# Patient Record
Sex: Male | Born: 1995
Health system: Southern US, Community
[De-identification: ages and names within clinical notes are randomized; demographics above are authoritative.]

## PROBLEM LIST (undated history)

## (undated) DIAGNOSIS — F909 Attention-deficit hyperactivity disorder, unspecified type: Secondary | ICD-10-CM

## (undated) DIAGNOSIS — S53106A Unspecified dislocation of unspecified ulnohumeral joint, initial encounter: Secondary | ICD-10-CM

## (undated) HISTORY — DX: Unspecified dislocation of unspecified ulnohumeral joint, initial encounter: S53.106A

## (undated) HISTORY — PX: ADENOIDECTOMY: SUR15

## (undated) HISTORY — PX: FRENULECTOMY, LINGUAL: SHX1681

## (undated) HISTORY — PX: MYRINGOTOMY: SUR874

## (undated) HISTORY — PX: LAPAROSCOPIC INTUSSUSCEPTION REPAIR: SHX1927

## (undated) HISTORY — PX: APPENDECTOMY: SHX54

---
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
## 2017-03-22 ENCOUNTER — Ambulatory Visit (INDEPENDENT_AMBULATORY_CARE_PROVIDER_SITE_OTHER): Payer: 59 | Admitting: Family Medicine

## 2017-03-22 ENCOUNTER — Encounter: Payer: Self-pay | Admitting: Family Medicine

## 2017-03-22 VITALS — BP 110/68 | HR 84 | Temp 98.0°F | Resp 12 | Ht 68.0 in | Wt 208.0 lb

## 2017-03-22 DIAGNOSIS — F988 Other specified behavioral and emotional disorders with onset usually occurring in childhood and adolescence: Secondary | ICD-10-CM

## 2017-03-22 MED ORDER — AMPHETAMINE-DEXTROAMPHET ER 20 MG PO CP24: 20.0000 mg | ORAL_CAPSULE | ORAL | 0 refills | Status: DC

## 2017-03-22 NOTE — Progress Notes (Signed)
Subjective:    Patient ID: Jonathan Abbott, male    DOB: 1995-11-27, 22 y.o.   MRN: [id number]  HPI Patient is a very pleasant 22 year old white male here today for refill of his ADD medication. He is currently taking Adderall XR 15 mg by mouth every morning. Patient just completed his first semester at [employer]. He is studying physical therapy. He did well in all of his classes. His lowest grade was a C.  However he is interested in increasing the dose of his medication. He can feel the medicine wore off around 5:00 in the afternoon. This does not give him sufficient time to complete his homework at night. Therefore he has a hard time focusing and concentrating and time to finish his homework and also to complete studying. He is interested in increasing the dose slightly to see if he can get longer effectiveness.  He denies any chest pain. He denies any palpitations. He denies any insomnia. He denies any weight loss. He denies any illicit drug use. Past Medical History:  Diagnosis Date  . ADHD (attention deficit hyperactivity disorder)    Past Surgical History:  Procedure Laterality Date  . ADENOIDECTOMY    . APPENDECTOMY    . FRENULECTOMY, LINGUAL    . LAPAROSCOPIC INTUSSUSCEPTION REPAIR    . MYRINGOTOMY     Current Outpatient Medications on File Prior to Visit  Medication Sig Dispense Refill  . acetaminophen (TYLENOL) 325 MG tablet Take 650 mg by mouth every 6 (six) hours as needed.    Marland Kitchen ibuprofen (ADVIL,MOTRIN) 200 MG tablet Take 200 mg by mouth every 6 (six) hours as needed.     No current facility-administered medications on file prior to visit.    Allergies  Allergen Reactions  . Azithromycin   . Bee Venom   . Zithromax [Azithromycin Dihydrate] Rash   Social History   Socioeconomic History  . Marital status: Single    Spouse name: Not on file  . Number of children: Not on file  . Years of education: Not on file  . Highest education level: Not on  file  Social Needs  . Financial resource strain: Not on file  . Food insecurity - worry: Not on file  . Food insecurity - inability: Not on file  . Transportation needs - medical: Not on file  . Transportation needs - non-medical: Not on file  Occupational History  . Not on file  Tobacco Use  . Smoking status: Never Smoker  . Smokeless tobacco: Never Used  Substance and Sexual Activity  . Alcohol use: No  . Drug use: No  . Sexual activity: Not on file    Comment: Lives with mom, dad, and brother.  Other Topics Concern  . Not on file  Social History Narrative   Freshman at [employer].  Plays safety in football, lacrosse, and basketball.        Review of Systems  All other systems reviewed and are negative.      Objective:   Physical Exam  Constitutional: He is oriented to person, place, and time. He appears well-developed and well-nourished.  Cardiovascular: Normal rate, regular rhythm and normal heart sounds. Exam reveals no gallop and no friction rub.  No murmur heard. Pulmonary/Chest: Effort normal and breath sounds normal. No respiratory distress. He has no wheezes. He has no rales.  Abdominal: Soft. Bowel sounds are normal. He exhibits no distension. There is no tenderness. There is no rebound and no guarding.  Neurological: He is alert and oriented to person, place, and time. He has normal reflexes. He displays normal reflexes. No cranial nerve deficit. He exhibits normal muscle tone. Coordination normal.  Psychiatric: He has a normal mood and affect. His behavior is normal. Judgment and thought content normal.  Vitals reviewed.         Assessment & Plan:  Attention deficit disorder (ADD) without hyperactivity  Increase Adderall XR to 20 mg by mouth every morning. Recheck in one month. If ineffective, consider splitting the dose and the twice a day dosing.

## 2017-04-29 ENCOUNTER — Other Ambulatory Visit: Payer: Self-pay | Admitting: Family Medicine

## 2017-04-29 MED ORDER — AMPHETAMINE-DEXTROAMPHET ER 20 MG PO CP24: 20.0000 mg | ORAL_CAPSULE | ORAL | 0 refills | Status: DC

## 2017-04-29 NOTE — Telephone Encounter (Signed)
Requesting refill  Adderall  LOV: 03/22/17  LRF:  03/22/17

## 2017-06-02 ENCOUNTER — Other Ambulatory Visit: Payer: Self-pay | Admitting: Family Medicine

## 2017-06-02 NOTE — Telephone Encounter (Signed)
Pt requesting refill on Adderall      LOV: 03/22/17 LRF:  04/29/17

## 2017-06-03 MED ORDER — AMPHETAMINE-DEXTROAMPHET ER 20 MG PO CP24: 20.0000 mg | ORAL_CAPSULE | ORAL | 0 refills | Status: DC

## 2017-07-20 ENCOUNTER — Other Ambulatory Visit: Payer: Self-pay | Admitting: Family Medicine

## 2017-07-20 MED ORDER — AMPHETAMINE-DEXTROAMPHET ER 20 MG PO CP24: 20.0000 mg | ORAL_CAPSULE | ORAL | 0 refills | Status: DC

## 2017-07-20 NOTE — Telephone Encounter (Signed)
Requesting refill    Adderall  LOV:  03/22/17  LRF:  06/03/17

## 2017-09-17 ENCOUNTER — Other Ambulatory Visit: Payer: Self-pay | Admitting: Family Medicine

## 2017-09-17 MED ORDER — AMPHETAMINE-DEXTROAMPHET ER 20 MG PO CP24: 20.0000 mg | ORAL_CAPSULE | ORAL | 0 refills | Status: DC

## 2017-09-17 NOTE — Telephone Encounter (Signed)
Pt requesting refill on Adderall      LOV: 03/22/17  LRF:   07/20/17

## 2017-11-19 ENCOUNTER — Other Ambulatory Visit: Payer: Self-pay | Admitting: Family Medicine

## 2017-11-19 MED ORDER — AMPHETAMINE-DEXTROAMPHET ER 20 MG PO CP24: 20.0000 mg | ORAL_CAPSULE | ORAL | 0 refills | Status: DC

## 2017-11-19 NOTE — Telephone Encounter (Signed)
Pt requesting refill on Adderall      LOV: 03/22/17  LRF: 09/17/17

## 2018-04-22 ENCOUNTER — Other Ambulatory Visit: Payer: Self-pay | Admitting: Family Medicine

## 2018-04-22 MED ORDER — AMPHETAMINE-DEXTROAMPHET ER 20 MG PO CP24: 20.0000 mg | ORAL_CAPSULE | ORAL | 0 refills | Status: DC

## 2018-04-22 NOTE — Telephone Encounter (Signed)
Pt requesting refill on Adderall      LOV:  03/22/17  LRF:   11/19/17

## 2018-04-22 NOTE — Telephone Encounter (Signed)
Mother of pt aware.

## 2018-04-22 NOTE — Telephone Encounter (Signed)
Needs ov, > 1 year

## 2018-04-27 MED ORDER — AMPHETAMINE-DEXTROAMPHET ER 20 MG PO CP24: 20.0000 mg | ORAL_CAPSULE | ORAL | 0 refills | Status: DC

## 2018-04-27 MED FILL — ADDERALL XR 20 MG CAP SA: 20 | 30 days supply | Qty: 30 | Fill #0

## 2018-04-27 NOTE — Telephone Encounter (Signed)
Please resend - was sent to wrong pharmacy.

## 2018-04-27 NOTE — Addendum Note (Signed)
Addended by: Legrand Rams B on: 04/27/2018 08:56 AM   Modules accepted: Orders

## 2018-09-09 ENCOUNTER — Other Ambulatory Visit: Payer: Self-pay

## 2018-09-09 ENCOUNTER — Encounter: Payer: Self-pay | Admitting: Family Medicine

## 2018-09-09 ENCOUNTER — Ambulatory Visit (INDEPENDENT_AMBULATORY_CARE_PROVIDER_SITE_OTHER): Payer: No Typology Code available for payment source | Admitting: Family Medicine

## 2018-09-09 VITALS — BP 122/64 | HR 100 | Temp 98.7°F | Resp 14 | Ht 68.0 in | Wt 186.0 lb

## 2018-09-09 DIAGNOSIS — F988 Other specified behavioral and emotional disorders with onset usually occurring in childhood and adolescence: Secondary | ICD-10-CM

## 2018-09-09 MED ORDER — AMPHETAMINE-DEXTROAMPHET ER 10 MG PO CP24: 10.0000 mg | ORAL_CAPSULE | Freq: Every day | ORAL | 0 refills | Status: DC

## 2018-09-09 NOTE — Progress Notes (Signed)
Subjective:    Patient ID: Jonathan Abbott, male    DOB: 30-Apr-1995, 23 y.o.   MRN: [id number]  HPI  03/2017 Patient is a very pleasant 23 year old white male here today for refill of his ADD medication. He is currently taking Adderall XR 15 mg by mouth every morning. Patient just completed his first semester at [employer]. He is studying physical therapy. He did well in all of his classes. His lowest grade was a C.  However he is interested in increasing the dose of his medication. He can feel the medicine wore off around 5:00 in the afternoon. This does not give him sufficient time to complete his homework at night. Therefore he has a hard time focusing and concentrating and time to finish his homework and also to complete studying. He is interested in increasing the dose slightly to see if he can get longer effectiveness.  He denies any chest pain. He denies any palpitations. He denies any insomnia. He denies any weight loss. He denies any illicit drug use.  At that time, my plan was: Increase Adderall XR to 20 mg by mouth every morning. Recheck in one month. If ineffective, consider splitting the dose and the twice a day dosing.  09/09/19 Patient is here today for follow-up.  He is doing very well.  He is currently out of school helping his father in [place].  However even while he was in school, he was not taking the whole Adderall pill.  He would open the capsule and just take half of it because he did not feel like he needed his much Adderall.  He was trying to wean himself off the medication.  He does not want to be on such a high dose.  He denies any insomnia from the medication.  He denies any palpitations.  He denies any anxiety.  He denies any chest pain or shortness of breath or dyspnea on exertion.  Often he does not even take the medication but without the medication he finds himself listless and unable to focus.  He states that he just sits around all day long at  home unmotivated.  Past Medical History:  Diagnosis Date   ADHD (attention deficit hyperactivity disorder)    Past Surgical History:  Procedure Laterality Date   ADENOIDECTOMY     APPENDECTOMY     FRENULECTOMY, LINGUAL     LAPAROSCOPIC INTUSSUSCEPTION REPAIR     MYRINGOTOMY     Current Outpatient Medications on File Prior to Visit  Medication Sig Dispense Refill   amphetamine-dextroamphetamine (ADDERALL XR) 20 MG 24 hr capsule Take 1 capsule (20 mg total) by mouth every morning. 30 capsule 0   No current facility-administered medications on file prior to visit.    Allergies  Allergen Reactions   Azithromycin    Bee Venom    Zithromax [Azithromycin Dihydrate] Rash   Social History   Socioeconomic History   Marital status: Single    Spouse name: Not on file   Number of children: Not on file   Years of education: Not on file   Highest education level: Not on file  Occupational History   Not on file  Social Needs   Financial resource strain: Not on file   Food insecurity    Worry: Not on file    Inability: Not on file   Transportation needs    Medical: Not on file    Non-medical: Not on file  Tobacco Use   Smoking status: Never  Smoker   Smokeless tobacco: Never Used  Substance and Sexual Activity   Alcohol use: No   Drug use: No   Sexual activity: Not on file    Comment: Lives with mom, dad, and brother.  Lifestyle   Physical activity    Days per week: Not on file    Minutes per session: Not on file   Stress: Not on file  Relationships   Social connections    Talks on phone: Not on file    Gets together: Not on file    Attends religious service: Not on file    Active member of club or organization: Not on file    Attends meetings of clubs or organizations: Not on file    Relationship status: Not on file   Intimate partner violence    Fear of current or ex partner: Not on file    Emotionally abused: Not on file    Physically  abused: Not on file    Forced sexual activity: Not on file  Other Topics Concern   Not on file  Social History Narrative   Freshman at [employer].  Plays safety in football, lacrosse, and basketball.        Review of Systems  All other systems reviewed and are negative.      Objective:   Physical Exam  Constitutional: He is oriented to person, place, and time. He appears well-developed and well-nourished.  Cardiovascular: Normal rate, regular rhythm and normal heart sounds. Exam reveals no gallop and no friction rub.  No murmur heard. Pulmonary/Chest: Effort normal and breath sounds normal. No respiratory distress. He has no wheezes. He has no rales.  Abdominal: Soft. Bowel sounds are normal. He exhibits no distension. There is no abdominal tenderness. There is no rebound and no guarding.  Neurological: He is alert and oriented to person, place, and time. He has normal reflexes. No cranial nerve deficit. He exhibits normal muscle tone. Coordination normal.  Psychiatric: He has a normal mood and affect. His behavior is normal. Judgment and thought content normal.  Vitals reviewed.         Assessment & Plan:  The encounter diagnosis was Attention deficit disorder (ADD) without hyperactivity. We discussed changing his medication.  I do not recommend that he break the capsule intake the medication as this could interfere with the extended release formulation.  Instead I recommended that we decrease the dose to 10 mg a day.  If he is doing well on that we can gradually try to step him down or ultimately stop the medication.  He does not want to stop the medication because he does feel that he benefits from however he would like to take a lower dose.

## 2018-10-20 ENCOUNTER — Other Ambulatory Visit: Payer: Self-pay | Admitting: Family Medicine

## 2018-10-20 MED ORDER — AMPHETAMINE-DEXTROAMPHET ER 10 MG PO CP24
10.0000 mg | ORAL_CAPSULE | Freq: Every day | ORAL | 0 refills | Status: DC
Start: 2018-10-20 — End: 2019-01-16

## 2018-10-20 MED FILL — ADDERALL XR 10 MG CAP SA: 10 | 90 days supply | Qty: 90 | Fill #0

## 2018-10-20 NOTE — Telephone Encounter (Signed)
Pt requesting refill on Adderall    (mom requesting 90 day supply - rx set up for 90 tabs)   LOV: 09/09/18  LRF:   09/09/18

## 2019-01-16 ENCOUNTER — Other Ambulatory Visit: Payer: Self-pay | Admitting: Family Medicine

## 2019-01-16 MED ORDER — AMPHETAMINE-DEXTROAMPHET ER 10 MG PO CP24: 10.0000 mg | ORAL_CAPSULE | Freq: Every day | ORAL | 0 refills | Status: DC

## 2019-01-16 MED FILL — ADDERALL XR 10 MG CAP SA: 10 | 90 days supply | Qty: 90 | Fill #0

## 2019-01-16 NOTE — Telephone Encounter (Signed)
Pt requesting refill on Adderall      LOV: 6/26/220  LRF:  10/20/2018

## 2019-01-26 MED FILL — ADDERALL XR 10 MG CAP SA: 10 | 90 days supply | Qty: 90 | Fill #0

## 2019-05-31 ENCOUNTER — Other Ambulatory Visit: Payer: Self-pay | Admitting: Family Medicine

## 2019-05-31 NOTE — Telephone Encounter (Signed)
Pt requesting refill on Adderall    (Only wants 30 day supply)   LOV: 09/09/18  LRF:   01/16/19

## 2019-06-01 MED ORDER — AMPHETAMINE-DEXTROAMPHET ER 10 MG PO CP24: 10.0000 mg | ORAL_CAPSULE | Freq: Every day | ORAL | 0 refills | Status: DC

## 2019-06-06 ENCOUNTER — Other Ambulatory Visit: Payer: Self-pay | Admitting: Family Medicine

## 2019-06-06 ENCOUNTER — Telehealth: Payer: Self-pay | Admitting: Family Medicine

## 2019-06-06 MED ORDER — AMPHETAMINE-DEXTROAMPHET ER 20 MG PO CP24: 20.0000 mg | ORAL_CAPSULE | Freq: Every day | ORAL | 0 refills | Status: DC

## 2019-06-06 NOTE — Telephone Encounter (Signed)
Please send rx - set up for 90 day supply - thinks his ins may cover 90 day cheaper. Needs to [facility] to [facility] [place] not [place] so she can pick it up for him.

## 2019-06-06 NOTE — Telephone Encounter (Signed)
Pt's mother called and is wanting to know if he could go up on is Adderall that the 10mg  just is not holding him.

## 2019-06-06 NOTE — Telephone Encounter (Signed)
I will increase it to 20 mg a day

## 2019-06-15 ENCOUNTER — Other Ambulatory Visit: Payer: Self-pay | Admitting: Family Medicine

## 2019-06-15 MED ORDER — AMPHETAMINE-DEXTROAMPHET ER 20 MG PO CP24: 20.0000 mg | ORAL_CAPSULE | Freq: Every day | ORAL | 0 refills | Status: DC

## 2020-09-25 ENCOUNTER — Telehealth: Payer: 59 | Admitting: Family

## 2020-09-25 DIAGNOSIS — L255 Unspecified contact dermatitis due to plants, except food: Secondary | ICD-10-CM

## 2020-09-25 MED ORDER — TRIAMCINOLONE ACETONIDE 0.5 % EX OINT: 1.0000 "application " | TOPICAL_OINTMENT | Freq: Two times a day (BID) | CUTANEOUS | 0 refills | Status: DC

## 2020-09-25 MED ORDER — PREDNISONE 10 MG PO TABS: ORAL_TABLET | ORAL | 0 refills | Status: DC

## 2020-09-25 NOTE — Progress Notes (Signed)
E-Visit for Poison Ivy  We are sorry that you are not feeing well.  Here is how we plan to help!  Based on what you have shared with me it looks like you have had an allergic reaction to the oily resin from a group of plants.  This resin is very sticky, so it easily attaches to your skin, clothing, tools equipment, and pet's fur.    This blistering rash is often called poison ivy rash although it can come from contact with the leaves, stems and roots of poison ivy, poison oak and poison sumac.  The oily resin contains urushiol (u-ROO-she-ol) that produces a skin rash on exposed skin.  The severity of the rash depends on the amount of urushiol that gets on your skin.  A section of skin with more urushiol on it may develop a rash sooner.  The rash usually develops 12-48 hours after exposure and can last two to three weeks.  Your skin must come in direct contact with the plant's oil to be affected.  Blister fluid doesn't spread the rash.  However, if you come into contact with a piece of clothing or pet fur that has urushiol on it, the rash may spread out.  You can also transfer the oil to other parts of your body with your fingers.  Often the rash looks like a straight line because of the way the plant brushes against your skin.  Since your rash is widespread or has resulted in a large number of blisters, I have prescribed an oral corticosteroid.  Please follow these recommendations:  I have sent a prednisone dose pack to your chosen pharmacy. Be sure to follow the instructions carefully and complete the entire prescription. You may use Benadryl or Caladryl topical lotions to sooth the itch and remember cool, not hot, showers and baths can help relieve the itching!  Place cool, wet compresses on the affected area for 15-30 minutes several times a day.  You may also take oral antihistamines, such as diphenhydramine (Benadryl, others), which may also help you sleep better.  Watch your skin for any purulent  (pus) drainage or red streaking from the site.  If this occurs, contact your provider.  You may require an antibiotic for a skin infection.  Make sure that the clothes you were wearing as well as any towels or sheets that may have come in contact with the oil (urushiol) are washed in detergent and hot water.       I have developed the following plan to treat your condition I am prescribing a two week course of steroids (37 tablets of 10 mg prednisone).  Days 1-4 take 4 tablets (40 mg) daily  Days 5-8 take 3 tablets (30 mg) daily, Days 9-11 take 2 tablets (20 mg) daily, Days 12-14 take 1 tablet (10 mg) daily.  I have also sent in kenalog cream twice a day. .    What can you do to prevent this rash?  Avoid the plants.  Learn how to identify poison ivy, poison oak and poison sumac in all seasons.  When hiking or engaging in other activities that might expose you to these plants, try to stay on cleared pathways.  If camping, make sure you pitch your tent in an area free of these plants.  Keep pets from running through wooded areas so that urushiol doesn't accidentally stick to their fur, which you may touch.  Remove or kill the plants.  In your yard, you can get rid   of poison ivy by applying an herbicide or pulling it out of the ground, including the roots, while wearing heavy gloves.  Afterward remove the gloves and thoroughly wash them and your hands.  Don't burn poison ivy or related plants because the urushiol can be carried by smoke.  Wear protective clothing.  If needed, protect your skin by wearing socks, boots, pants, long sleeves and vinyl gloves.  Wash your skin right away.  Washing off the oil with soap and water within 30 minutes of exposure may reduce your chances of getting a poison ivy rash.  Even washing after an hour or so can help reduce the severity of the rash.  If you walk through some poison ivy and then later touch your shoes, you may get some urushiol on your hands, which may then  transfer to your face or body by touching or rubbing.  If the contaminated object isn't cleaned, the urushiol on it can still cause a skin reaction years later.    Be careful not to reuse towels after you have washed your skin.  Also carefully wash clothing in detergent and hot water to remove all traces of the oil.  Handle contaminated clothing carefully so you don't transfer the urushiol to yourself, furniture, rugs or appliances.  Remember that pets can carry the oil on their fur and paws.  If you think your pet may be contaminated with urushiol, put on some long rubber gloves and give your pet a bath.  Finally, be careful not to burn these plants as the smoke can contain traces of the oil.  Inhaling the smoke may result in difficulty breathing. If that occurred you should see a physician as soon as possible.  See your doctor right away if:  The reaction is severe or widespread You inhaled the smoke from burning poison ivy and are having difficulty breathing Your skin continues to swell The rash affects your eyes, mouth or genitals Blisters are oozing pus You develop a fever greater than 100 F (37.8 C) The rash doesn't get better within a few weeks.  If you scratch the poison ivy rash, bacteria under your fingernails may cause the skin to become infected.  See your doctor if pus starts oozing from the blisters.  Treatment generally includes antibiotics.  Poison ivy treatments are usually limited to self-care methods.  And the rash typically goes away on its own in two to three weeks.     If the rash is widespread or results in a large number of blisters, your doctor may prescribe an oral corticosteroid, such as prednisone.  If a bacterial infection has developed at the rash site, your doctor may give you a prescription for an oral antibiotic.  MAKE SURE YOU  Understand these instructions. Will watch your condition. Will get help right away if you are not doing well or get  worse.   Thank you for choosing an e-visit.  Your e-visit answers were reviewed by a board certified advanced clinical practitioner to complete your personal care plan. Depending upon the condition, your plan could have included both over the counter or prescription medications.  Please review your pharmacy choice. Make sure the pharmacy is open so you can pick up prescription now. If there is a problem, you may contact your provider through MyChart messaging and have the prescription routed to another pharmacy.  Your safety is important to us. If you have drug allergies check your prescription carefully.   For the next 24 hours you   can use MyChart to ask questions about today's visit, request a non-urgent call back, or ask for a work or school excuse. You will get an email in the next two days asking about your experience. I hope that your e-visit has been valuable and will speed your recovery.  Approximately 5 minutes was spent documenting and reviewing patient's chart.      

## 2021-01-12 ENCOUNTER — Encounter (HOSPITAL_BASED_OUTPATIENT_CLINIC_OR_DEPARTMENT_OTHER): Payer: Self-pay

## 2021-01-12 ENCOUNTER — Emergency Department (HOSPITAL_BASED_OUTPATIENT_CLINIC_OR_DEPARTMENT_OTHER): Payer: 59 | Admitting: Radiology

## 2021-01-12 ENCOUNTER — Emergency Department (HOSPITAL_BASED_OUTPATIENT_CLINIC_OR_DEPARTMENT_OTHER)
Admission: EM | Admit: 2021-01-12 | Discharge: 2021-01-12 | Disposition: A | Discharge status: Home / Self Care | Payer: 59 | Attending: Emergency Medicine | Admitting: Emergency Medicine

## 2021-01-12 ENCOUNTER — Other Ambulatory Visit: Payer: Self-pay

## 2021-01-12 DIAGNOSIS — S42402A Unspecified fracture of lower end of left humerus, initial encounter for closed fracture: Secondary | ICD-10-CM

## 2021-01-12 DIAGNOSIS — S59902A Unspecified injury of left elbow, initial encounter: Secondary | ICD-10-CM | POA: Diagnosis present

## 2021-01-12 DIAGNOSIS — S53105A Unspecified dislocation of left ulnohumeral joint, initial encounter: Secondary | ICD-10-CM | POA: Diagnosis not present

## 2021-01-12 DIAGNOSIS — Y9355 Activity, bike riding: Secondary | ICD-10-CM | POA: Insufficient documentation

## 2021-01-12 DIAGNOSIS — Y9289 Other specified places as the place of occurrence of the external cause: Secondary | ICD-10-CM | POA: Insufficient documentation

## 2021-01-12 MED ORDER — LIDOCAINE HCL (PF) 1 % IJ SOLN
30.0000 mL | Freq: Once | INTRAMUSCULAR | Status: DC
  Filled 2021-01-12: qty 30

## 2021-01-12 MED ORDER — MIDAZOLAM HCL 2 MG/2ML IJ SOLN: 8.0000 mg | Freq: Once | INTRAMUSCULAR | Status: DC

## 2021-01-12 MED ORDER — MIDAZOLAM HCL 2 MG/2ML IJ SOLN
4.0000 mg | Freq: Once | INTRAMUSCULAR | Status: AC
  Administered 2021-01-12: 16:00:00 4 mg via INTRAVENOUS
  Filled 2021-01-12: qty 4

## 2021-01-12 MED ORDER — OXYCODONE-ACETAMINOPHEN 5-325 MG PO TABS
1.0000 | ORAL_TABLET | ORAL | Status: DC | PRN
  Administered 2021-01-12: 14:00:00 1 via ORAL
  Filled 2021-01-12: qty 1

## 2021-01-12 MED ORDER — ONDANSETRON 4 MG PO TBDP
4.0000 mg | ORAL_TABLET | Freq: Once | ORAL | Status: AC
  Administered 2021-01-12: 14:00:00 4 mg via ORAL
  Filled 2021-01-12: qty 1

## 2021-01-12 NOTE — Discharge Instructions (Addendum)
Follow-up with orthopedic surgery within the week.  Keep your arm in the splint.  Do not get it wet.  Return to the ER if you have worsening pain fevers cough or any additional concerns.
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
## 2021-01-23 ENCOUNTER — Ambulatory Visit (INDEPENDENT_AMBULATORY_CARE_PROVIDER_SITE_OTHER): Payer: 59 | Admitting: Physical Therapy

## 2021-01-23 ENCOUNTER — Other Ambulatory Visit: Payer: Self-pay

## 2021-01-23 DIAGNOSIS — R600 Localized edema: Secondary | ICD-10-CM

## 2021-01-23 DIAGNOSIS — M25622 Stiffness of left elbow, not elsewhere classified: Secondary | ICD-10-CM | POA: Diagnosis not present

## 2021-01-23 DIAGNOSIS — M6281 Muscle weakness (generalized): Secondary | ICD-10-CM

## 2021-01-23 DIAGNOSIS — M25522 Pain in left elbow: Secondary | ICD-10-CM
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
## 2021-01-23 NOTE — Patient Instructions (Signed)
Access Code: [email or web address] URL: [email or web address] Date: 01/23/2021 Prepared by: Vernon Prey April Kirstie Peri  Exercises Supported Elbow Flexion Extension PROM - 1 x daily - 7 x weekly - 2 sets - 10 reps Supine Elbow Extension Stretch in Supination - 1 x daily - 7 x weekly - 3 sets - 5 min hold Seated Wrist Extension with Overpressure - 1 x daily - 7 x weekly - 2 sets - 30 sec hold Bicep Stretch at Table - 1 x daily - 7 x weekly - 2 sets - 30 sec hold Standing Bilateral Low Shoulder Row with Anchored Resistance - 1 x daily - 7 x weekly - 2 sets - 10 reps Standing Lat Pull Down with Resistance - Elbows Bent - 1 x daily - 7 x weekly - 2 sets - 10 reps

## 2021-02-03 ENCOUNTER — Other Ambulatory Visit: Payer: Self-pay

## 2021-02-03 ENCOUNTER — Ambulatory Visit (INDEPENDENT_AMBULATORY_CARE_PROVIDER_SITE_OTHER): Payer: 59 | Admitting: Physical Therapy

## 2021-02-03 DIAGNOSIS — M25522 Pain in left elbow: Secondary | ICD-10-CM

## 2021-02-03 DIAGNOSIS — R600 Localized edema: Secondary | ICD-10-CM

## 2021-02-03 DIAGNOSIS — M6281 Muscle weakness (generalized): Secondary | ICD-10-CM | POA: Diagnosis not present

## 2021-02-03 DIAGNOSIS — M25622 Stiffness of left elbow, not elsewhere classified: Secondary | ICD-10-CM | POA: Diagnosis not present

## 2021-02-03 NOTE — Therapy (Signed)
[facility] [place] [address] [place] [place], [place], [address] Phone: [phone]   Fax:  [phone]  Physical Therapy Treatment  Patient Details  Name: EXCELL NEYLAND MRN: [id number] Date of Birth: 1995/10/15 Referring Provider (PT): Ramond Marrow   Encounter Date: 02/03/2021   PT End of Session - 02/03/21 0929     Visit Number 2    Number of Visits 6    Date for PT Re-Evaluation 03/06/21    Authorization Type [facility] [employer]    PT Start Time [phone]    PT Stop Time 0929    PT Time Calculation (min) 39 min    Activity Tolerance Patient tolerated treatment well    Behavior During Therapy [facility] for tasks assessed/performed             Past Medical History:  Diagnosis Date   ADHD (attention deficit hyperactivity disorder)     Past Surgical History:  Procedure Laterality Date   ADENOIDECTOMY     APPENDECTOMY     FRENULECTOMY, LINGUAL     LAPAROSCOPIC INTUSSUSCEPTION REPAIR     MYRINGOTOMY      There were no vitals filed for this visit.   Subjective Assessment - 02/03/21 0930     Subjective Pt reports that elbow is feeling better. He has been using it more. States he has not been able to use his L hand to eat yet.    Limitations Lifting    How long can you sit comfortably? n/a    How long can you stand comfortably? n/a    How long can you walk comfortably? n/a    Patient Stated Goals Improve movement, regain strength    Currently in Pain? No/denies                [facility] PT Assessment - 02/03/21 0001       AROM   Left Elbow Flexion 125    Left Elbow Extension -25      PROM   Left Elbow Flexion 140    Left Elbow Extension -15                           [facility] Adult PT Treatment/Exercise - 02/03/21 0001       Elbow Exercises   Elbow Flexion AROM;Strengthening;Left;10 reps;Seated   AROM and then iso holds 2x10x3 sec   Bar Weights/Barbell (Elbow Flexion) 1 lb;2 lbs    Elbow  Extension AROM;Strengthening;10 reps;Left;Seated   and then 2x10x3 sec iso holds   Bar Weights/Barbell (Elbow Extension) --   overhead tricep   Forearm Supination AROM;Left;10 reps    Forearm Pronation AROM;Left;10 reps    Other elbow exercises wrist flexion & extension 2x10 2lbs    Other elbow exercises wrist extensor stretch x30 sec      Manual Therapy   Manual Therapy Joint mobilization    Joint Mobilization grade II mobs for elbow flexion & extension                          PT Long Term Goals - 01/23/21 1232       PT LONG TERM GOAL #1   Title Pt will be independent with HEP    Time 6    Period Weeks    Status New    Target Date 03/06/21      PT LONG TERM GOAL #2   Title Pt will be  able to demo L grip strength of at least 85# to be more equal to R    Baseline 60-75# on L    Time 6    Period Weeks    Status New    Target Date 03/06/21      PT LONG TERM GOAL #3   Title Pt will demo at least 5 to 145 deg of elbow flexion on L    Baseline 45 to 125 deg    Time 6    Period Weeks    Status New    Target Date 03/06/21      PT LONG TERM GOAL #4   Title Pt will be able to safely perform at least 10 push-ups to demo improved functional L elbow and shoulder strength, stability and ROM.    Baseline Unable to perform    Time 6    Period Weeks    Status New    Target Date 03/06/21                   Plan - 02/03/21 0932     Clinical Impression Statement Pt with improving active L elbow ROM. Provided gentle manual therapy to continue to increase ROM with ~10 deg increase in flexion & extension by end of session (able to reach his mouth for feeding). Continued to work on gentle strengthening within pt's available range -- able to tolerate 2# bicep, currently performing overhead tricep against gravity only. Pt endorses no pain just a strange sensation. Updated pt's exercises to include more concentric and isometric strengthening.    Personal Factors and  Comorbidities Age;Fitness;Time since onset of injury/illness/exacerbation    Examination-Activity Limitations Bathing;Dressing;Hygiene/Grooming;Self Feeding;Reach Overhead    Examination-Participation Restrictions Meal Prep;Cleaning;Yard Work;Laundry;Driving;Community Activity    Stability/Clinical Decision Making Stable/Uncomplicated    Rehab Potential Good    PT Frequency --   every other week until pt is cleared for more lifting; and then 1x/wk   PT Duration 8 weeks    PT Treatment/Interventions ADLs/Self Care Home Management;Cryotherapy;Electrical Stimulation;Iontophoresis 4mg /ml Dexamethasone;Moist Heat;Functional mobility training;Therapeutic activities;Therapeutic exercise;Neuromuscular re-education;Patient/family education;Manual techniques;Passive range of motion;Dry needling;Taping;Vasopneumatic Device    PT Next Visit Plan Reassess ROM. Continue stretching as able. Initiate gentle iso and 2# weight for strengthening.    PT Home Exercise Plan Access Code [email or web address]    Consulted and Agree with Plan of Care Patient             Patient will benefit from skilled therapeutic intervention in order to improve the following deficits and impairments:  Decreased range of motion, Increased fascial restricitons, Impaired UE functional use, Decreased activity tolerance, Pain, Hypomobility, Decreased mobility, Decreased strength, Increased edema, Postural dysfunction  Visit Diagnosis: Stiffness of left elbow, not elsewhere classified  Pain in left elbow  Localized edema  Muscle weakness (generalized)     Problem List There are no problems to display for this patient.   [facility] [address], PT, DPT 02/03/2021, 9:33 AM  [facility] [address] [place] [address] [place], [place], [place] Phone: [phone]   Fax:  [phone]  Name: Jonathan Abbott MRN: Cira Rue Date of Birth: 15-Jun-1995

## 2021-02-17 ENCOUNTER — Ambulatory Visit (INDEPENDENT_AMBULATORY_CARE_PROVIDER_SITE_OTHER): Payer: 59 | Admitting: Physical Therapy

## 2021-02-17 ENCOUNTER — Other Ambulatory Visit: Payer: Self-pay

## 2021-02-17 DIAGNOSIS — M6281 Muscle weakness (generalized): Secondary | ICD-10-CM | POA: Diagnosis not present

## 2021-02-17 DIAGNOSIS — M25522 Pain in left elbow: Secondary | ICD-10-CM | POA: Diagnosis not present

## 2021-02-17 DIAGNOSIS — M25622 Stiffness of left elbow, not elsewhere classified: Secondary | ICD-10-CM | POA: Diagnosis not present

## 2021-02-17 DIAGNOSIS — R600 Localized edema: Secondary | ICD-10-CM

## 2021-02-17 NOTE — Therapy (Addendum)
[facility] [place] [address] [place] [address] [place], [place], [address] Phone: [phone]   Fax:  [phone]  Physical Therapy Treatment  Patient Details  Name: Jonathan Abbott MRN: [id number] Date of Birth: 1996-01-30 Referring Provider (PT): Ramond Marrow   Encounter Date: 02/17/2021   PT End of Session - 02/17/21 0804     Visit Number 3    Number of Visits 6    Date for PT Re-Evaluation 03/06/21    Authorization Type [facility] Choice [employer]    PT Start Time 0802    PT Stop Time 0845    PT Time Calculation (min) 43 min    Activity Tolerance Patient tolerated treatment well    Behavior During Therapy [facility] for tasks assessed/performed             Past Medical History:  Diagnosis Date   ADHD (attention deficit hyperactivity disorder)     Past Surgical History:  Procedure Laterality Date   ADENOIDECTOMY     APPENDECTOMY     FRENULECTOMY, LINGUAL     LAPAROSCOPIC INTUSSUSCEPTION REPAIR     MYRINGOTOMY      There were no vitals filed for this visit.   Subjective Assessment - 02/17/21 0805     Subjective Pt reports just some residual tightness at end ranges. Otherwise, doing well. Pt will see Dr. Everardo Pacific tomorrow.    Limitations Lifting    How long can you sit comfortably? n/a    How long can you stand comfortably? n/a    How long can you walk comfortably? n/a    Patient Stated Goals Improve movement, regain strength    Currently in Pain? No/denies                [facility] PT Assessment - 02/17/21 0001       AROM   Right Elbow Flexion 150    Right Elbow Extension 0    Left Elbow Flexion 140   improved to 150 at end of session   Left Elbow Extension -18   -2 deg by end of session                          [facility] Adult PT Treatment/Exercise - 02/17/21 0001       Elbow Exercises   Elbow Flexion AROM;Strengthening;Left;10 reps;Seated    Bar Weights/Barbell (Elbow Flexion) 3 lbs    Elbow Extension  AROM;Strengthening;10 reps;Left;Seated    Forearm Supination AROM;Left;10 reps    Forearm Pronation AROM;Left;10 reps    Other elbow exercises Shoulder ER, IR, horizontal abd red tband x10;      Manual Therapy   Manual Therapy Soft tissue mobilization    Manual therapy comments Skilled palpation, STM and assessment for TPDN response    Joint Mobilization grade II mobs for elbow flexion    Soft tissue mobilization STM and TPR forearm extensor group              Trigger Point Dry Needling - 02/17/21 0001     Consent Given? Yes    Education Handout Provided Yes    Muscles Treated Wrist/Hand Extensor carpi radialis longus/brevis;Extensor digitorum;Extensor carpi ulnaris    Other Dry Needling Supinator and brachioradialis also needled with twitch response and noted increased muscle length    Extensor carpi radialis longus/brevis Response Twitch response elicited;Palpable increased muscle length    Extensor digitorum Response Twitch response elicited;Palpable increased muscle length    Extensor carpi ulnaris Response  Twitch response elicited;Palpable increased muscle length                        PT Long Term Goals - 02/17/21 0849       PT LONG TERM GOAL #1   Title Pt will be independent with HEP    Time 6    Period Weeks    Status On-going      PT LONG TERM GOAL #2   Title Pt will be able to demo L grip strength of at least 85# to be more equal to R    Baseline 60-75# on L    Time 6    Period Weeks    Status On-going      PT LONG TERM GOAL #3   Title Pt will demo at least 5 to 145 deg of elbow flexion on L    Baseline 45 to 125 deg    Time 6    Period Weeks    Status Achieved      PT LONG TERM GOAL #4   Title Pt will be able to safely perform at least 10 push-ups to demo improved functional L elbow and shoulder strength, stability and ROM.    Baseline Unable to perform    Time 6    Period Weeks    Status On-going                   Plan -  02/17/21 7035     Clinical Impression Statement Pt continues to progress well with his ROM and strength. Some residual tightness at his end ranges -- performed TPDN this session to address this with improved motion by end of session to full range. Pt continues to have no pain. Encouraged pt to begin to increase his strengthenig to his 5lb limit and include shoulder exercises (IR, ER, and horizontal abduction). Discussed d/c next visit after seeing Dr. Everardo Pacific tomorrow 02/17/21.    Personal Factors and Comorbidities Age;Fitness;Time since onset of injury/illness/exacerbation    Examination-Activity Limitations Bathing;Dressing;Hygiene/Grooming;Self Feeding;Reach Overhead    Examination-Participation Restrictions Meal Prep;Cleaning;Yard Work;Laundry;Driving;Community Activity    Stability/Clinical Decision Making Stable/Uncomplicated    Rehab Potential Good    PT Frequency --   every other week until pt is cleared for more lifting; and then 1x/wk   PT Duration 8 weeks    PT Treatment/Interventions ADLs/Self Care Home Management;Cryotherapy;Electrical Stimulation;Iontophoresis 4mg /ml Dexamethasone;Moist Heat;Functional mobility training;Therapeutic activities;Therapeutic exercise;Neuromuscular re-education;Patient/family education;Manual techniques;Passive range of motion;Dry needling;Taping;Vasopneumatic Device    PT Next Visit Plan Reassess ROM. Continue stretching and manual therapy as indicated. Progress strengthening (consider closed chain exercises, i.e. push ups and dips)    PT Home Exercise Plan Access Code [email or web address]    Consulted and Agree with Plan of Care Patient             Patient will benefit from skilled therapeutic intervention in order to improve the following deficits and impairments:  Decreased range of motion, Increased fascial restricitons, Impaired UE functional use, Decreased activity tolerance, Pain, Hypomobility, Decreased mobility, Decreased strength, Increased edema, Postural  dysfunction  Visit Diagnosis: Stiffness of left elbow, not elsewhere classified  Pain in left elbow  Localized edema  Muscle weakness (generalized)     Problem List There are no problems to display for this patient.   [facility] April May, PT, DPT 02/17/2021, 12:45 PM  [facility] [address] [place], [place], [place] Phone: [phone]   Fax:  [phone]  Name: Jonathan Abbott MRN: [id number] Date of Birth: October 22, 1995

## 2021-03-03 ENCOUNTER — Other Ambulatory Visit: Payer: Self-pay

## 2021-03-03 ENCOUNTER — Ambulatory Visit (INDEPENDENT_AMBULATORY_CARE_PROVIDER_SITE_OTHER): Payer: 59 | Admitting: Physical Therapy

## 2021-03-03 DIAGNOSIS — M25622 Stiffness of left elbow, not elsewhere classified: Secondary | ICD-10-CM

## 2021-03-03 DIAGNOSIS — R600 Localized edema: Secondary | ICD-10-CM | POA: Diagnosis not present

## 2021-03-03 DIAGNOSIS — M25522 Pain in left elbow: Secondary | ICD-10-CM

## 2021-03-03 DIAGNOSIS — M6281 Muscle weakness (generalized): Secondary | ICD-10-CM | POA: Diagnosis not present

## 2021-03-03 NOTE — Therapy (Addendum)
[place], [place], [address] Phone: [phone]   Fax:  [phone]  Physical Therapy Treatment and Discharge  Patient Details  Name: Jonathan Abbott MRN: [id number] Date of Birth: [birth year] Referring Provider (PT): Ophelia Charter   PHYSICAL THERAPY DISCHARGE SUMMARY  Visits from Start of Care: 4  Current functional level related to goals / functional outcomes: See below   Remaining deficits: See below   Education / Equipment: See below   Patient agrees to discharge. Patient goals were met. Patient is being discharged due to meeting the stated rehab goals.  Encounter Date: 03/03/2021   PT End of Session - 03/03/21 0858     Visit Number 4    Number of Visits 6    Date for PT Re-Evaluation 03/06/21    Authorization Type [facility] Choice [employer]    PT Start Time [phone]    PT Stop Time 0930    PT Time Calculation (min) 35 min    Activity Tolerance Patient tolerated treatment well    Behavior During Therapy [facility] for tasks assessed/performed             Past Medical History:  Diagnosis Date   ADHD (attention deficit hyperactivity disorder)     Past Surgical History:  Procedure Laterality Date   ADENOIDECTOMY     APPENDECTOMY     FRENULECTOMY, LINGUAL     LAPAROSCOPIC INTUSSUSCEPTION REPAIR     MYRINGOTOMY      There were no vitals filed for this visit.                      [place] Adult PT Treatment/Exercise - 03/03/21 0001       Elbow Exercises   Other elbow exercises Shoulder ER in abduction 2x10 4lbs; lat pull down 2x10 30 lbs    Other elbow exercises Elbow extended sup/pron 4 lbs 2x10; counter push up 3x10      Wrist Exercises   Other wrist exercises Wrist extension and flexion stretches x30 sec each      Manual Therapy   Manual therapy comments Skilled palpation, STM and assessment for TPDN response    Soft tissue mobilization STM and TPR forearm  extensor group              Trigger Point Dry Needling - 03/03/21 0001     Dry Needling Comments Performed on pronator teres and supinator with twitch response    Extensor carpi ulnaris Response Twitch response elicited;Palpable increased muscle length                   PT Education - 03/03/21 1128     Education Details Discussed self care with continuing self trigger point release. Discussed slowly increasing his weight with his exercises.    Person(s) Educated Patient    Methods Explanation;Demonstration;Tactile cues;Verbal cues;Handout    Comprehension Verbalized understanding;Returned demonstration;Verbal cues required                 PT Long Term Goals - 03/03/21 1131       PT LONG TERM GOAL #1   Title Pt will be independent with HEP    Time 6    Period Weeks    Status Achieved      PT LONG TERM GOAL #2   Title Pt will be able to demo L grip strength of at least 85# to be more equal to R    Baseline 60-75#  on L    Time 6    Period Weeks    Status Partially Met      PT LONG TERM GOAL #3   Title Pt will demo at least 5 to 145 deg of elbow flexion on L    Baseline full ROM    Time 6    Period Weeks    Status Achieved      PT LONG TERM GOAL #4   Title Pt will be able to safely perform at least 10 push-ups to demo improved functional L elbow and shoulder strength, stability and ROM.    Baseline Unable to perform    Time 6    Period Weeks    Status Achieved                   Plan - 03/03/21 1129     Clinical Impression Statement Pt demos full elbow ROM. Continues to have some point tenderness and tightness at end rage flexion. Has been progressing himself to 4-6 lbs of weight. Pt has met his LTGs and is ready for PT d/c.    Personal Factors and Comorbidities Age;Fitness;Time since onset of injury/illness/exacerbation    Examination-Activity Limitations Bathing;Dressing;Hygiene/Grooming;Self Feeding;Reach Overhead     Examination-Participation Restrictions Meal Prep;Cleaning;Yard Work;Laundry;Driving;Community Activity    Stability/Clinical Decision Making Stable/Uncomplicated    Rehab Potential Good    PT Frequency --   every other week until pt is cleared for more lifting; and then 1x/wk   PT Duration 8 weeks    PT Treatment/Interventions ADLs/Self Care Home Management;Cryotherapy;Electrical Stimulation;Iontophoresis 61m/ml Dexamethasone;Moist Heat;Functional mobility training;Therapeutic activities;Therapeutic exercise;Neuromuscular re-education;Patient/family education;Manual techniques;Passive range of motion;Dry needling;Taping;Vasopneumatic Device    PT Next Visit Plan Reassess ROM. Continue stretching and manual therapy as indicated. Progress strengthening (consider closed chain exercises, i.e. push ups and dips)    PT Home Exercise Plan Access Code [email or web address]   Consulted and Agree with Plan of Care Patient             Patient will benefit from skilled therapeutic intervention in order to improve the following deficits and impairments:  Decreased range of motion, Increased fascial restricitons, Impaired UE functional use, Decreased activity tolerance, Pain, Hypomobility, Decreased mobility, Decreased strength, Increased edema, Postural dysfunction  Visit Diagnosis: Stiffness of left elbow, not elsewhere classified  Pain in left elbow  Localized edema  Muscle weakness (generalized)     Problem List There are no problems to display for this patient.   [facility] PT, DPT 03/03/2021, 11:31 AM  [facility] [address]Phone: [phone]  Fax:  [phone] Name: JTAISHAWN SMALDONEMRN: [id number] of Birth: [birth year]

## 2021-08-29 ENCOUNTER — Emergency Department (HOSPITAL_COMMUNITY): Payer: 59

## 2021-08-29 ENCOUNTER — Emergency Department (INDEPENDENT_AMBULATORY_CARE_PROVIDER_SITE_OTHER)
Admission: EM | Admit: 2021-08-29 | Discharge: 2021-08-29 | Disposition: A | Discharge status: Home / Self Care | Payer: 59 | Source: Home / Self Care

## 2021-08-29 ENCOUNTER — Encounter (HOSPITAL_COMMUNITY): Payer: Self-pay

## 2021-08-29 ENCOUNTER — Other Ambulatory Visit: Payer: Self-pay

## 2021-08-29 ENCOUNTER — Emergency Department (HOSPITAL_COMMUNITY)
Admission: EM | Admit: 2021-08-29 | Discharge: 2021-08-29 | Disposition: A | Discharge status: Home / Self Care | Payer: 59 | Attending: Emergency Medicine | Admitting: Emergency Medicine

## 2021-08-29 ENCOUNTER — Emergency Department (HOSPITAL_BASED_OUTPATIENT_CLINIC_OR_DEPARTMENT_OTHER)
Admission: EM | Admit: 2021-08-29 | Discharge: 2021-08-29 | Discharge status: Against Medical Advice | Payer: 59 | Source: Home / Self Care

## 2021-08-29 DIAGNOSIS — S61309A Unspecified open wound of unspecified finger with damage to nail, initial encounter: Secondary | ICD-10-CM | POA: Diagnosis not present

## 2021-08-29 DIAGNOSIS — S61312A Laceration without foreign body of right middle finger with damage to nail, initial encounter: Secondary | ICD-10-CM | POA: Insufficient documentation

## 2021-08-29 DIAGNOSIS — Y9389 Activity, other specified: Secondary | ICD-10-CM | POA: Insufficient documentation

## 2021-08-29 DIAGNOSIS — W232XXA Caught, crushed, jammed or pinched between a moving and stationary object, initial encounter: Secondary | ICD-10-CM | POA: Insufficient documentation

## 2021-08-29 DIAGNOSIS — Z23 Encounter for immunization: Secondary | ICD-10-CM | POA: Insufficient documentation

## 2021-08-29 DIAGNOSIS — S6991XA Unspecified injury of right wrist, hand and finger(s), initial encounter: Secondary | ICD-10-CM | POA: Diagnosis not present

## 2021-08-29 DIAGNOSIS — S62632A Displaced fracture of distal phalanx of right middle finger, initial encounter for closed fracture: Secondary | ICD-10-CM | POA: Diagnosis not present

## 2021-08-29 MED ORDER — TETANUS-DIPHTH-ACELL PERTUSSIS 5-2.5-18.5 LF-MCG/0.5 IM SUSY
0.5000 mL | PREFILLED_SYRINGE | Freq: Once | INTRAMUSCULAR | Status: AC
  Administered 2021-08-29: 0.5 mL via INTRAMUSCULAR
  Filled 2021-08-29: qty 0.5

## 2021-08-29 MED ORDER — LIDOCAINE HCL 2 % IJ SOLN
10.0000 mL | Freq: Once | INTRAMUSCULAR | Status: AC
  Administered 2021-08-29: 200 mg
  Filled 2021-08-29: qty 20

## 2021-08-29 MED ORDER — CEPHALEXIN 500 MG PO CAPS: 500.0000 mg | ORAL_CAPSULE | Freq: Four times a day (QID) | ORAL | 0 refills | Status: DC

## 2021-08-29 NOTE — Discharge Instructions (Signed)
Go to the ER for laceration repair due to extent of the injury.

## 2021-08-29 NOTE — ED Notes (Signed)
Patient is being discharged from the Urgent Care and sent to the Emergency Department via POV driven by family. Per Wess Botts, PA, patient is in need of higher level of care due to severe finger laceration. Patient is aware and verbalizes understanding of plan of care.  Vitals:   08/29/21 1658  BP: 123/86  Pulse: (!) 47  Resp: 14  Temp: 97.8 F (36.6 C)  SpO2: 98%
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
## 2021-08-29 NOTE — Discharge Instructions (Addendum)
Do not soak the wound.  Please keep it clean and dry.  I would avoid lawn care until you follow-up with Dr. Orlan Leavens next week.  Please take antibiotics as prescribed.  Please return to the emergency room for any worsening symptoms.
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
## 2021-08-29 NOTE — ED Notes (Signed)
Dressing applied with guaze, kling & tape ?

## 2021-09-04 DIAGNOSIS — Z8781 Personal history of (healed) traumatic fracture: Secondary | ICD-10-CM | POA: Insufficient documentation

## 2021-09-04 DIAGNOSIS — S61312A Laceration without foreign body of right middle finger with damage to nail, initial encounter: Secondary | ICD-10-CM | POA: Diagnosis not present

## 2021-09-04 DIAGNOSIS — S62662B Nondisplaced fracture of distal phalanx of right middle finger, initial encounter for open fracture: Secondary | ICD-10-CM | POA: Diagnosis not present

## 2021-09-11 DIAGNOSIS — S62662B Nondisplaced fracture of distal phalanx of right middle finger, initial encounter for open fracture: Secondary | ICD-10-CM | POA: Diagnosis not present

## 2021-09-11 DIAGNOSIS — S61312A Laceration without foreign body of right middle finger with damage to nail, initial encounter: Secondary | ICD-10-CM | POA: Diagnosis not present

## 2022-01-07 ENCOUNTER — Telehealth: Payer: 59 | Admitting: Family Medicine

## 2022-01-07 DIAGNOSIS — B9689 Other specified bacterial agents as the cause of diseases classified elsewhere: Secondary | ICD-10-CM

## 2022-01-07 DIAGNOSIS — J019 Acute sinusitis, unspecified: Secondary | ICD-10-CM | POA: Diagnosis not present

## 2022-01-07 MED ORDER — AMOXICILLIN-POT CLAVULANATE 875-125 MG PO TABS: 1.0000 | ORAL_TABLET | Freq: Two times a day (BID) | ORAL | 0 refills | Status: AC

## 2022-01-07 NOTE — Progress Notes (Signed)

## 2022-04-02 ENCOUNTER — Encounter: Payer: Self-pay | Admitting: Family Medicine

## 2022-04-02 ENCOUNTER — Ambulatory Visit (INDEPENDENT_AMBULATORY_CARE_PROVIDER_SITE_OTHER): Payer: 59 | Admitting: Family Medicine

## 2022-04-02 VITALS — BP 126/74 | HR 66 | Temp 98.5°F | Ht 68.0 in | Wt 172.4 lb

## 2022-04-02 DIAGNOSIS — Z0000 Encounter for general adult medical examination without abnormal findings: Secondary | ICD-10-CM
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
## 2022-04-03 LAB — COMPLETE METABOLIC PANEL WITH GFR
AG Ratio: 1.6 (calc) (ref 1.0–2.5)
ALT: 40 U/L (ref 9–46)
AST: 27 U/L (ref 10–40)
Albumin: 4.9 g/dL (ref 3.6–5.1)
Alkaline phosphatase (APISO): 54 U/L (ref 36–130)
BUN: 16 mg/dL (ref 7–25)
CO2: 27 mmol/L (ref 20–32)
Calcium: 10 mg/dL (ref 8.6–10.3)
Chloride: 104 mmol/L (ref 98–110)
Creat: 1.03 mg/dL (ref 0.60–1.24)
Globulin: 3 g/dL (calc) (ref 1.9–3.7)
Glucose, Bld: 89 mg/dL (ref 65–99)
Potassium: 4.8 mmol/L (ref 3.5–5.3)
Sodium: 141 mmol/L (ref 135–146)
Total Bilirubin: 2.8 mg/dL — ABNORMAL HIGH (ref 0.2–1.2)
Total Protein: 7.9 g/dL (ref 6.1–8.1)
eGFR: 104 mL/min/{1.73_m2} (ref >=60)

## 2022-04-03 LAB — CBC WITH DIFFERENTIAL/PLATELET
Absolute Monocytes: 313 cells/uL (ref 200–950)
Basophils Absolute: 22 cells/uL (ref 0–200)
Basophils Relative: 0.4 %
Eosinophils Absolute: 119 cells/uL (ref 15–500)
Eosinophils Relative: 2.2 %
HCT: 43.9 % (ref 38.5–50.0)
Hemoglobin: 15.4 g/dL (ref 13.2–17.1)
Lymphs Abs: 2063 cells/uL (ref 850–3900)
MCH: 31.4 pg (ref 27.0–33.0)
MCHC: 35.1 g/dL (ref 32.0–36.0)
MCV: 89.4 fL (ref 80.0–100.0)
MPV: 11.6 fL (ref 7.5–12.5)
Monocytes Relative: 5.8 %
Neutro Abs: 2884 cells/uL (ref 1500–7800)
Neutrophils Relative %: 53.4 %
Platelets: 238 10*3/uL (ref 140–400)
RBC: 4.91 10*6/uL (ref 4.20–5.80)
RDW: 12 % (ref 11.0–15.0)
Total Lymphocyte: 38.2 %
WBC: 5.4 10*3/uL (ref 3.8–10.8)

## 2022-04-03 LAB — LIPID PANEL
Cholesterol: 160 mg/dL (ref <200)
HDL: 53 mg/dL (ref >=40)
LDL Cholesterol (Calc): 94 mg/dL (calc)
Non-HDL Cholesterol (Calc): 107 mg/dL (calc) (ref <130)
Total CHOL/HDL Ratio: 3 (calc) (ref <5.0)
Triglycerides: 43 mg/dL (ref <150)

## 2022-04-07 ENCOUNTER — Telehealth: Payer: Self-pay

## 2022-04-07 NOTE — Telephone Encounter (Signed)
Pt called in unsure if he needs to come in to have more labs done. Pt states that he was not sure if he needed more tests added to the labs he already had drawn. Please advise.  Cb#: [id number]

## 2022-04-09 ENCOUNTER — Other Ambulatory Visit: Payer: 59

## 2022-04-09 DIAGNOSIS — R17 Unspecified jaundice: Secondary | ICD-10-CM

## 2022-04-10 LAB — BILIRUBIN, FRACTIONATED(TOT/DIR/INDIR)
Bilirubin, Direct: 0.5 mg/dL — ABNORMAL HIGH (ref 0.0–0.2)
Indirect Bilirubin: 2.1 mg/dL (calc) — ABNORMAL HIGH (ref 0.2–1.2)
Total Bilirubin: 2.6 mg/dL — ABNORMAL HIGH (ref 0.2–1.2)

## 2022-07-01 ENCOUNTER — Telehealth: Payer: 59 | Admitting: Physician Assistant

## 2022-07-01 DIAGNOSIS — B9689 Other specified bacterial agents as the cause of diseases classified elsewhere: Secondary | ICD-10-CM | POA: Diagnosis not present

## 2022-07-01 DIAGNOSIS — J019 Acute sinusitis, unspecified: Secondary | ICD-10-CM

## 2022-07-01 MED ORDER — AMOXICILLIN-POT CLAVULANATE 875-125 MG PO TABS
1.0000 | ORAL_TABLET | Freq: Two times a day (BID) | ORAL | 0 refills | Status: DC
Start: 2022-07-01 — End: 2023-04-05

## 2022-07-01 NOTE — Progress Notes (Signed)

## 2023-04-05 ENCOUNTER — Encounter: Payer: Self-pay | Admitting: Family Medicine

## 2023-04-05 ENCOUNTER — Ambulatory Visit (INDEPENDENT_AMBULATORY_CARE_PROVIDER_SITE_OTHER): Payer: 59 | Admitting: Family Medicine

## 2023-04-05 VITALS — BP 126/72 | HR 54 | Temp 97.9°F | Ht 68.0 in | Wt 175.8 lb

## 2023-04-05 DIAGNOSIS — Z0000 Encounter for general adult medical examination without abnormal findings: Secondary | ICD-10-CM

## 2023-04-05 DIAGNOSIS — Z0001 Encounter for general adult medical examination with abnormal findings: Secondary | ICD-10-CM | POA: Diagnosis not present
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
## 2023-04-06 LAB — COMPLETE METABOLIC PANEL WITH GFR
AG Ratio: 1.8 (calc) (ref 1.0–2.5)
ALT: 21 U/L (ref 9–46)
AST: 20 U/L (ref 10–40)
Albumin: 4.6 g/dL (ref 3.6–5.1)
Alkaline phosphatase (APISO): 54 U/L (ref 36–130)
BUN: 12 mg/dL (ref 7–25)
CO2: 28 mmol/L (ref 20–32)
Calcium: 9.7 mg/dL (ref 8.6–10.3)
Chloride: 105 mmol/L (ref 98–110)
Creat: 0.85 mg/dL (ref 0.60–1.24)
Globulin: 2.5 g/dL (ref 1.9–3.7)
Glucose, Bld: 89 mg/dL (ref 65–99)
Potassium: 4.5 mmol/L (ref 3.5–5.3)
Sodium: 140 mmol/L (ref 135–146)
Total Bilirubin: 2.3 mg/dL — ABNORMAL HIGH (ref 0.2–1.2)
Total Protein: 7.1 g/dL (ref 6.1–8.1)
eGFR: 124 mL/min/{1.73_m2} (ref >=60)

## 2023-04-06 LAB — LIPID PANEL
Cholesterol: 136 mg/dL (ref <200)
HDL: 47 mg/dL (ref >=40)
LDL Cholesterol (Calc): 74 mg/dL
Non-HDL Cholesterol (Calc): 89 mg/dL (ref <130)
Total CHOL/HDL Ratio: 2.9 (calc) (ref <5.0)
Triglycerides: 70 mg/dL (ref <150)

## 2023-04-06 LAB — CBC WITH DIFFERENTIAL/PLATELET
Absolute Lymphocytes: 1901 {cells}/uL (ref 850–3900)
Absolute Monocytes: 250 {cells}/uL (ref 200–950)
Basophils Absolute: 10 {cells}/uL (ref 0–200)
Basophils Relative: 0.2 %
Eosinophils Absolute: 69 {cells}/uL (ref 15–500)
Eosinophils Relative: 1.4 %
HCT: 42.7 % (ref 38.5–50.0)
Hemoglobin: 14.6 g/dL (ref 13.2–17.1)
MCH: 30.8 pg (ref 27.0–33.0)
MCHC: 34.2 g/dL (ref 32.0–36.0)
MCV: 90.1 fL (ref 80.0–100.0)
MPV: 12 fL (ref 7.5–12.5)
Monocytes Relative: 5.1 %
Neutro Abs: 2671 {cells}/uL (ref 1500–7800)
Neutrophils Relative %: 54.5 %
Platelets: 220 10*3/uL (ref 140–400)
RBC: 4.74 10*6/uL (ref 4.20–5.80)
RDW: 12 % (ref 11.0–15.0)
Total Lymphocyte: 38.8 %
WBC: 4.9 10*3/uL (ref 3.8–10.8)

## 2023-04-22 IMAGING — DX DG ELBOW 2V*L*
1 series · 2 of 2 positions shown · non-contrast
Comparison: None.

CLINICAL DATA: Dislocation, postreduction.

EXAM:
LEFT ELBOW - 2 VIEW

[Series 1: elbow · 0.14mm/px · 2 of 2 slices shown]
[im 1/2]
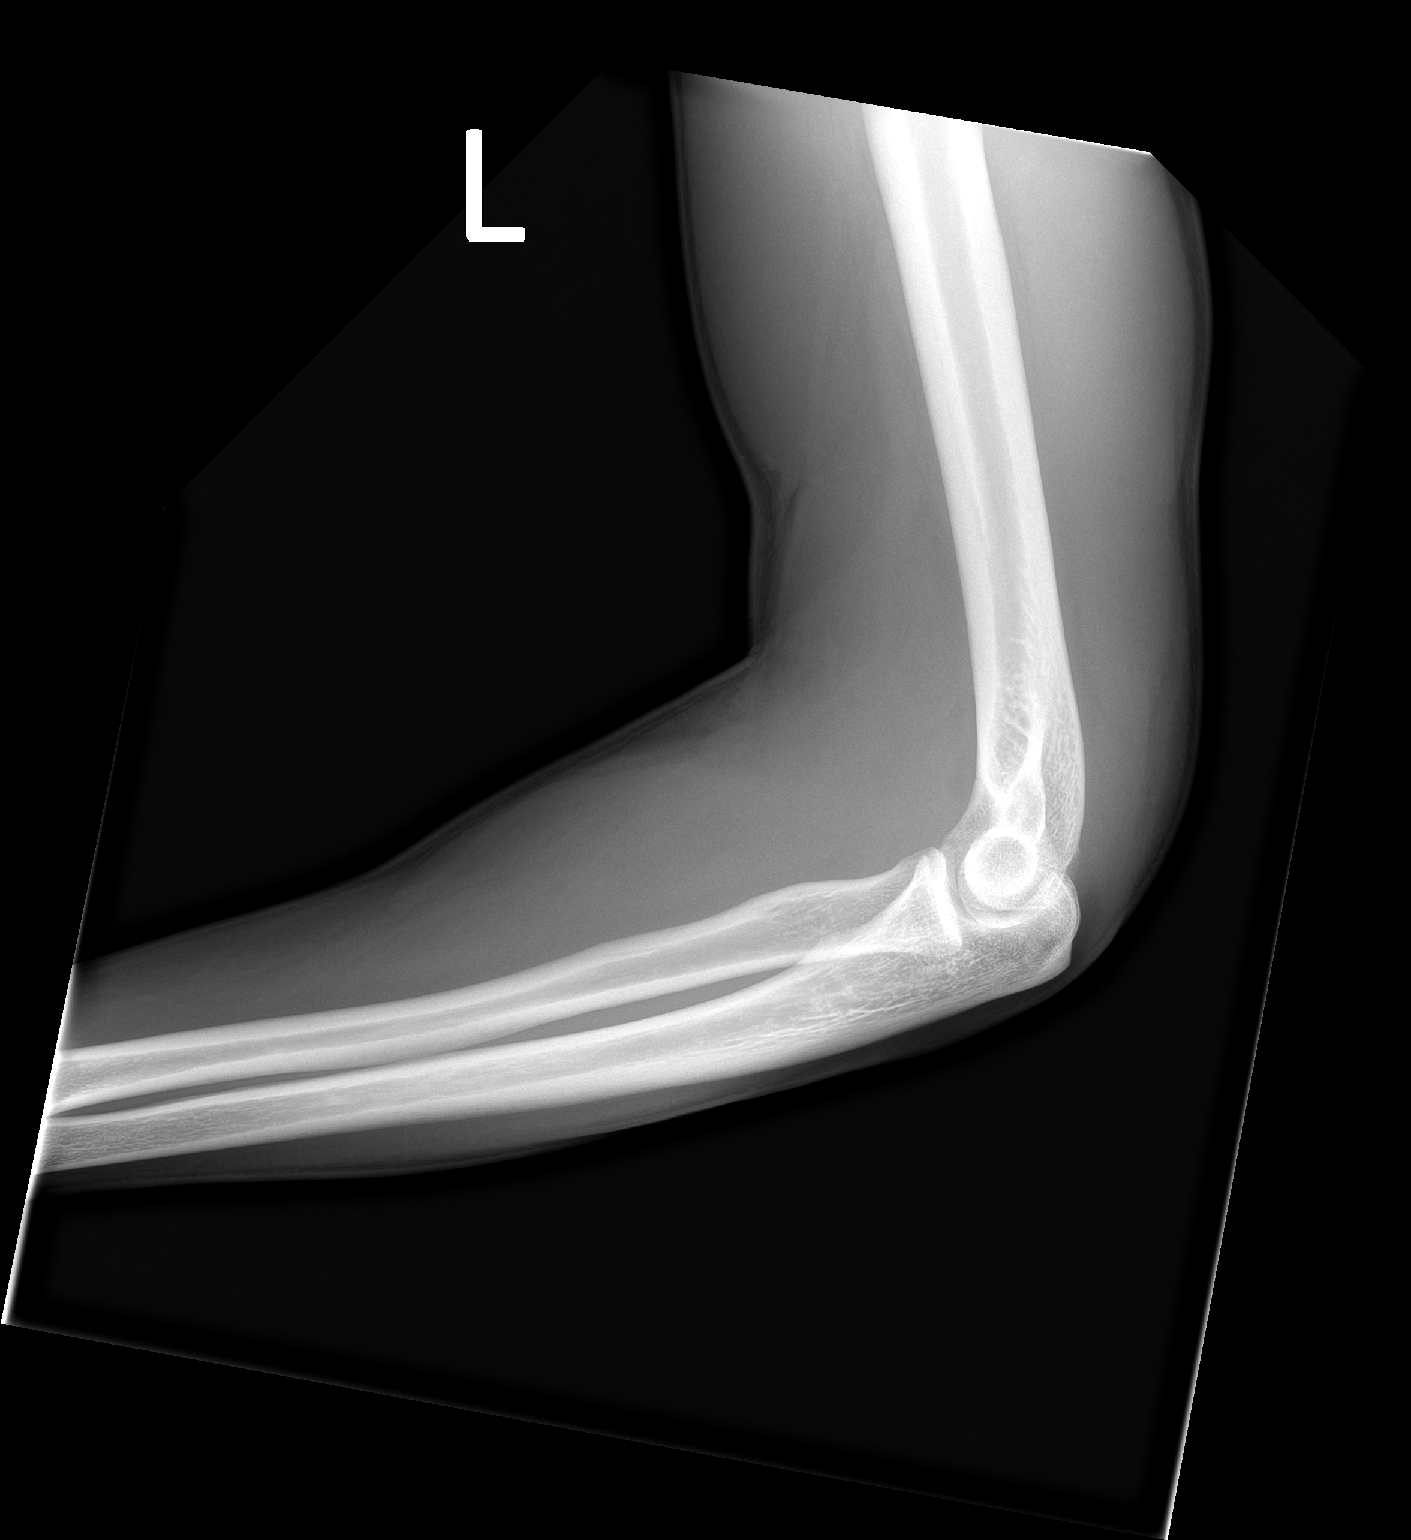
[im 2/2]
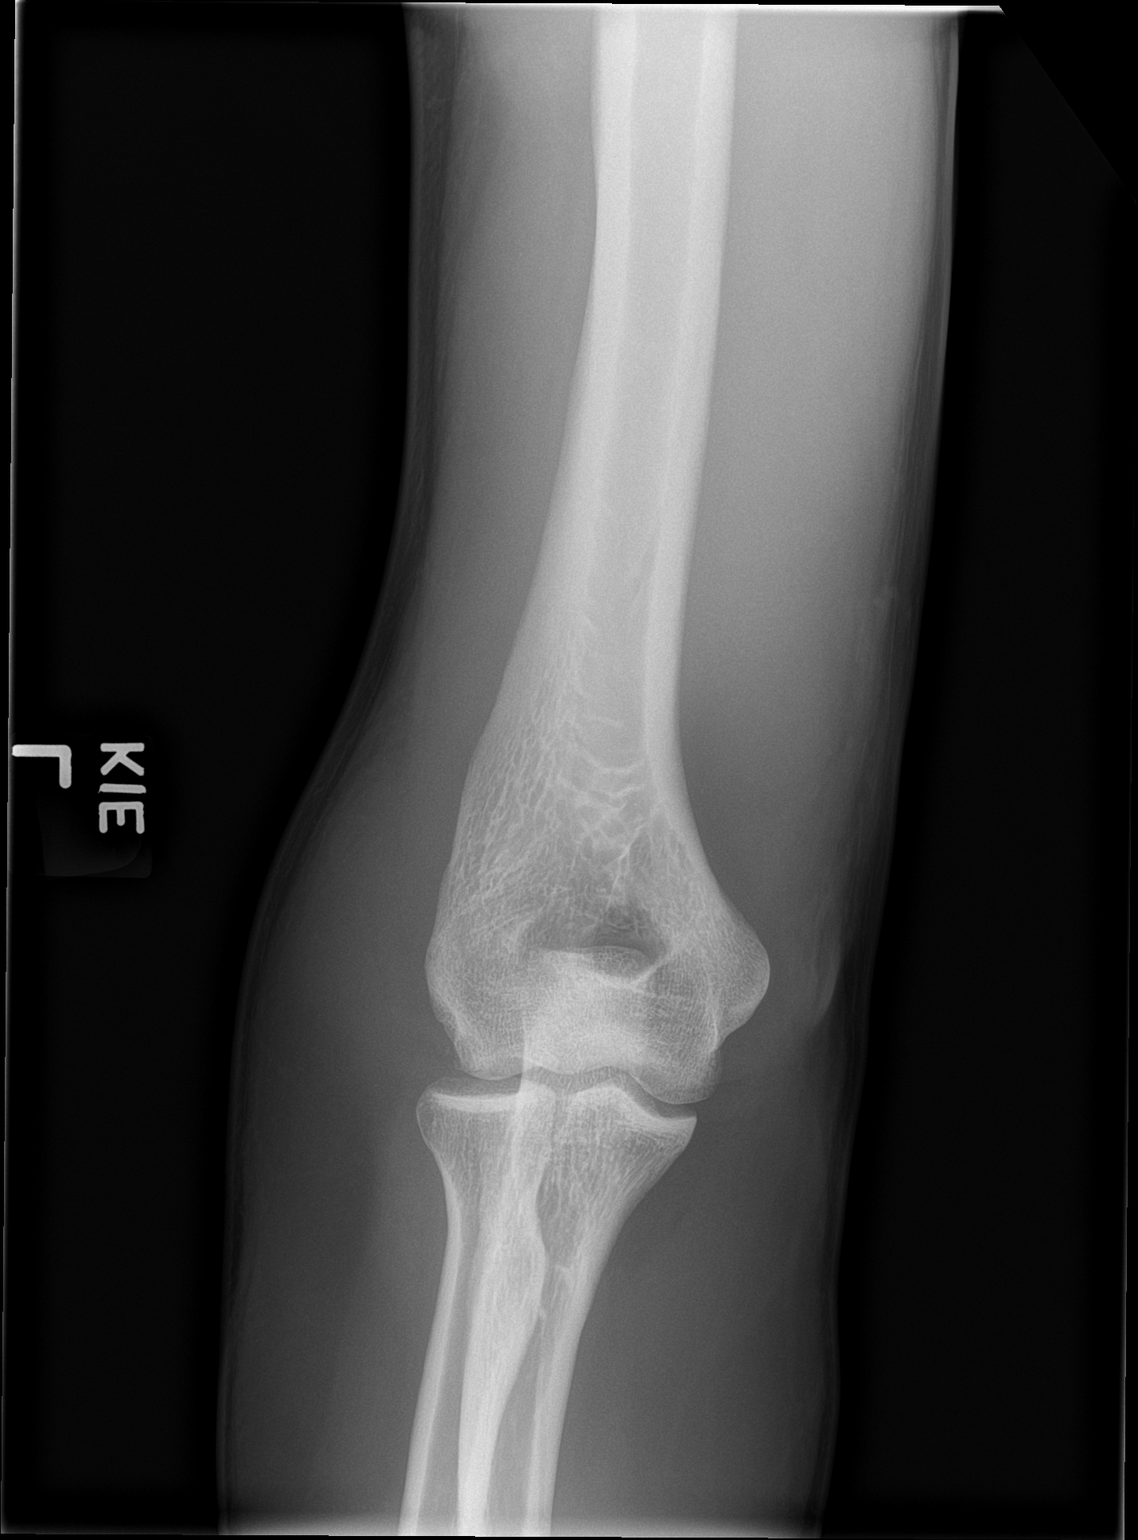

[2 of 2 positions shown; findings below may reference images not displayed]

FINDINGS: Previous elbow dislocation has been reduced. The faint osseous
fragments adjacent to the olecranon on pre reduction exam are
obscured currently. No visualized fracture lucency. There is a small
elbow joint effusion.
IMPRESSION: Reduction of previous elbow dislocation. Previous small bony
fragments adjacent to the olecranon are not seen on the current
exam. Small elbow joint effusion.
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
